# Patient Record
Sex: Female | Born: 1980 | Race: Asian | Hispanic: No | Marital: Married | Smoking: Never smoker
Health system: Southern US, Community
[De-identification: ages and names within clinical notes are randomized; demographics above are authoritative.]

---

## 2015-03-06 ENCOUNTER — Encounter: Payer: Self-pay | Admitting: Family Medicine

## 2015-03-06 ENCOUNTER — Ambulatory Visit (INDEPENDENT_AMBULATORY_CARE_PROVIDER_SITE_OTHER): Payer: BLUE CROSS/BLUE SHIELD | Admitting: Family Medicine

## 2015-03-06 VITALS — BP 106/68 | HR 76 | Temp 98.3°F | Resp 16 | Ht 60.0 in | Wt 101.4 lb

## 2015-03-06 DIAGNOSIS — R1013 Epigastric pain: Secondary | ICD-10-CM | POA: Diagnosis not present

## 2015-03-06 DIAGNOSIS — Z789 Other specified health status: Secondary | ICD-10-CM

## 2015-03-06 LAB — POCT CBC
GRANULOCYTE PERCENT: 74.8 % (ref 37–80)
HCT, POC: 41 % (ref 37.7–47.9)
Hemoglobin: 13.5 g/dL (ref 12.2–16.2)
LYMPH, POC: 1 (ref 0.6–3.4)
MCH: 30.3 pg (ref 27–31.2)
MCHC: 32.9 g/dL (ref 31.8–35.4)
MCV: 92 fL (ref 80–97)
MID (cbc): 0.3 (ref 0–0.9)
MPV: 7.5 fL (ref 0–99.8)
POC GRANULOCYTE: 4 (ref 2–6.9)
POC LYMPH PERCENT: 19.5 %L (ref 10–50)
POC MID %: 5.7 % (ref 0–12)
Platelet Count, POC: 243 10*3/uL (ref 142–424)
RBC: 4.45 M/uL (ref 4.04–5.48)
RDW, POC: 13.9 %
WBC: 5.3 10*3/uL (ref 4.6–10.2)

## 2015-03-06 LAB — COMPLETE METABOLIC PANEL WITH GFR
ALK PHOS: 37 U/L — AB (ref 39–117)
ALT: 14 U/L (ref 0–35)
AST: 17 U/L (ref 0–37)
Albumin: 4.7 g/dL (ref 3.5–5.2)
BILIRUBIN TOTAL: 0.6 mg/dL (ref 0.2–1.2)
BUN: 12 mg/dL (ref 6–23)
CHLORIDE: 105 meq/L (ref 96–112)
CO2: 28 mEq/L (ref 19–32)
Calcium: 8.9 mg/dL (ref 8.4–10.5)
Creat: 0.51 mg/dL (ref 0.50–1.10)
GFR, Est African American: >89 mL/min
Glucose, Bld: 98 mg/dL (ref 70–99)
POTASSIUM: 3.8 meq/L (ref 3.5–5.3)
Sodium: 139 mEq/L (ref 135–145)
Total Protein: 7.2 g/dL (ref 6.0–8.3)

## 2015-03-06 LAB — POCT UA - MICROSCOPIC ONLY
Casts, Ur, LPF, POC: NEGATIVE
Crystals, Ur, HPF, POC: NEGATIVE
Mucus, UA: NEGATIVE
Yeast, UA: NEGATIVE

## 2015-03-06 LAB — POCT URINALYSIS DIPSTICK
BILIRUBIN UA: NEGATIVE
GLUCOSE UA: NEGATIVE
Ketones, UA: NEGATIVE
NITRITE UA: NEGATIVE
Protein, UA: NEGATIVE
UROBILINOGEN UA: 0.2
pH, UA: 5.5

## 2015-03-06 MED ORDER — PANTOPRAZOLE SODIUM 40 MG PO TBEC: 40.0000 mg | DELAYED_RELEASE_TABLET | Freq: Every day | ORAL | Status: DC

## 2015-03-06 NOTE — Patient Instructions (Addendum)
Take the pantoprazole one pill daily in the morning before breakfast. Wait about 30 minutes before you eat.  Take the first pill today when you get the medicine  If you get worse pain or fevers either return here or go to the emergency room at any time  If you're not doing better in the next 2 or 3 weeks you should return for a recheck  One laboratory test is still pending. It went to another lab and we will let you know the results of that in a few days when I get it. If it is all normal allergist send you a normal lab letter. If I have anything I need to communicate someone will try and call you.  We do not need to send you to a specialist for an endoscopy at this time, but if problems continued to persist we would need to consider doing so.  [facility] [address]

## 2015-03-06 NOTE — Progress Notes (Signed)
Subjective:  Patient ID: Lindsey Church, female    DOB: 05/22/1981  Age: 34 y.o. MRN: [id number]  34 year old lady who is here complaining of epigastric pain. She's been hurting a good deal last 2 days. It is not related to eating or moving her bowels or urinating. She has had a problem also on over the last couple of years but this is worse. She is gravida 2 para 2 with last child being 32-year-old. She is not on any regular medications. She's not taking any medicine for this. We used a telephone Mandarin interpreter for her. She was accompanied by her husband who does speak some [place]. Apparently she has been in the [place] for 10 years and works in a [employer] but has not learned any significant English. The husband says he works in Clinical biochemist questions in [place]. Objective:   No acute distress. Healthy-appearing young lady. Chest clear. Heart regular without murmurs. Abdomen has normal bowel sounds. She has a lot of stretch marks. It is soft without organomegaly or masses. Mild tenderness in the epigastrium.  Assessment & Plan:   Assessment: Epigastric and abdominal pain  Plan: CBC, urinalysis, and CMP. The patient communicated through the telephone interpreter and seems to understand. She expresses she had no more questions.   Will treat her symptomatically with lansoprazole.  Results for orders placed or performed in visit on 03/06/15  POCT CBC  Result Value Ref Range   WBC 5.3 4.6 - 10.2 K/uL   Lymph, poc 1.0 0.6 - 3.4   POC LYMPH PERCENT 19.5 10 - 50 %L   MID (cbc) 0.3 0 - 0.9   POC MID % 5.7 0 - 12 %M   POC Granulocyte 4.0 2 - 6.9   Granulocyte percent 74.8 37 - 80 %G   RBC 4.45 4.04 - 5.48 M/uL   Hemoglobin 13.5 12.2 - 16.2 g/dL   HCT, POC 11.9 14.7 - 47.9 %   MCV 92.0 80 - 97 fL   MCH, POC 30.3 27 - 31.2 pg   MCHC 32.9 31.8 - 35.4 g/dL   RDW, POC 82.9 %   Platelet Count, POC 243 142 - 424 K/uL   MPV 7.5 0 - 99.8 fL  POCT urinalysis dipstick   Result Value Ref Range   Color, UA clear    Clarity, UA clear    Glucose, UA neg    Bilirubin, UA neg    Ketones, UA neg    Spec Grav, UA <=1.005    Blood, UA large    pH, UA 5.5    Protein, UA neg    Urobilinogen, UA 0.2    Nitrite, UA neg    Leukocytes, UA Trace   POCT UA - Microscopic Only  Result Value Ref Range   WBC, Ur, HPF, POC 0-1    RBC, urine, microscopic 0-1    Bacteria, U Microscopic trace    Mucus, UA neg    Epithelial cells, urine per micros 0-3    Crystals, Ur, HPF, POC neg    Casts, Ur, LPF, POC neg    Yeast, UA neg     Patient Instructions  Take the pantoprazole one pill daily in the morning before breakfast. Wait about 30 minutes before you eat.  Take the first pill today when you get the medicine  If you get worse pain or fevers either return here or go to the emergency room at any time  If you're not doing better in the next  2 or 3 weeks you should return for a recheck  One laboratory test is still pending. It went to another lab and we will let you know the results of that in a few days when I get it. If it is all normal allergist send you a normal lab letter. If I have anything I need to communicate someone will try and call you.  We do not need to send you to a specialist for an endoscopy at this time, but if problems continued to persist we would need to consider doing so.  [facility] [address] from [employer]     Beryle Bagsby, MD 03/06/2015

## 2015-03-07 ENCOUNTER — Encounter: Payer: Self-pay | Admitting: Radiology

## 2015-07-25 ENCOUNTER — Ambulatory Visit (INDEPENDENT_AMBULATORY_CARE_PROVIDER_SITE_OTHER): Payer: BLUE CROSS/BLUE SHIELD | Admitting: Internal Medicine

## 2015-07-25 VITALS — BP 100/62 | HR 97 | Temp 98.6°F | Resp 18 | Ht 60.0 in | Wt 102.0 lb

## 2015-07-25 DIAGNOSIS — M546 Pain in thoracic spine: Secondary | ICD-10-CM

## 2015-07-25 DIAGNOSIS — B079 Viral wart, unspecified: Secondary | ICD-10-CM

## 2015-07-25 LAB — POCT URINALYSIS DIP (MANUAL ENTRY)
BILIRUBIN UA: NEGATIVE
Blood, UA: NEGATIVE
GLUCOSE UA: NEGATIVE
Ketones, POC UA: NEGATIVE
Leukocytes, UA: NEGATIVE
NITRITE UA: NEGATIVE
Protein Ur, POC: NEGATIVE
Spec Grav, UA: 1.01
Urobilinogen, UA: 0.2
pH, UA: 6

## 2015-07-25 LAB — POC MICROSCOPIC URINALYSIS (UMFC): Mucus: ABSENT

## 2015-07-25 MED ORDER — MELOXICAM 15 MG PO TABS: 15.0000 mg | ORAL_TABLET | Freq: Every day | ORAL | Status: DC

## 2015-07-25 NOTE — Progress Notes (Signed)
   Subjective:    Patient ID: Lindsey Church, female    DOB: 07/28/1981, 34 y.o.   MRN: [id number] This chart was scribed for Ellamae Siaobert Maddeline Roorda, MD by Jolene Provostobert Halas, Medical Scribe. This patient was seen in Room 12 and the patient's care was started a 4:08 PM.  Chief Complaint  Patient presents with  . Back Pain    rt side x1 week    Here with husband and both speak Mandarin  HPI HPI Comments: Lindsey Church is a 34 y.o. female who [facility] of R flank pain for the 4 days. She denies abdominal pain, nausea, or vomiting. She also denies SOB,cough, or difficulty breathing. No dysuria frequency urgency. Although uncertain of any injury, She has increased pain with spinal rotation and with bending over.   She is also complaining of warts for the last several months on her right hand. LMP 10/10  No meds or illnesses  Review of Systems  Constitutional: Negative for fever and chills.  Gastrointestinal: Negative for nausea, vomiting, diarrhea and constipation.  Genitourinary: Negative for dysuria, urgency and frequency.      Objective:  BP 100/62 mmHg  Pulse 97  Temp(Src) 98.6 F (37 C) (Oral)  Resp 18  Ht 5' (1.524 m)  Wt 102 lb (46.267 kg)  BMI 19.92 kg/m2  SpO2 98%  LMP 07/13/2015  Physical Exam  Constitutional: She is oriented to person, place, and time. She appears well-developed and well-nourished. No distress.  HENT:  Head: Normocephalic and atraumatic.  Eyes: Pupils are equal, round, and reactive to light.  Neck: Normal range of motion. Neck supple. No thyromegaly present.  Cardiovascular: Normal rate, regular rhythm and normal heart sounds.   Pulmonary/Chest: Effort normal and breath sounds normal. No respiratory distress.  Abdominal: Soft. There is no tenderness.  Musculoskeletal: Normal range of motion.  Tender at the lower costal margin on the right posteriorly without defect or swelling.  No ecchymoses Pain worse with twisting and reaching    Lymphadenopathy:    She has no cervical adenopathy.  Neurological: She is alert and oriented to person, place, and time. Coordination normal.  Skin: Skin is warm and dry. She is not diaphoretic.  Plantar warts palmer surface, fingers 2 and 3 on the right hand.  Psychiatric: She has a normal mood and affect. Her behavior is normal.  Nursing note and vitals reviewed. BP 100/62 mmHg  Pulse 97  Temp(Src) 98.6 F (37 C) (Oral)  Resp 18  Ht 5' (1.524 m)  Wt 102 lb (46.267 kg)  BMI 19.92 kg/m2  SpO2 98%  LMP 07/13/2015  Procedure: Liquid nitrogen application to the 2 warts with full thickness freezing twice each    Assessment & Plan:  Right-sided thoracic back pain - Plan: POCT Microscopic Urinalysis ([facility]), POCT urinalysis dipstick --Stretching, heat, meloxicam for 3 weeks with follow-up evaluation and x-rays if not well  Cutaneous wart --Follow-up 3-4 weeks for additional freezing if necessary   I have completed the patient encounter in its entirety as documented by the scribe, with editing by me where necessary. Dow Blahnik P. Merla Richesoolittle, M.D.   By signing my name below, I, Javier Dockerobert Ryan Halas, attest that this documentation has been prepared under the direction and in the presence of Ellamae Siaobert Codey Burling, MD. Electronically Signed: Javier Dockerobert Ryan Halas, ER Scribe. 07/25/2015. 4:08 PM.

## 2016-04-16 ENCOUNTER — Ambulatory Visit (INDEPENDENT_AMBULATORY_CARE_PROVIDER_SITE_OTHER): Payer: BLUE CROSS/BLUE SHIELD | Admitting: Physician Assistant

## 2016-04-16 ENCOUNTER — Ambulatory Visit: Payer: BLUE CROSS/BLUE SHIELD

## 2016-04-16 VITALS — BP 108/68 | HR 75 | Temp 98.1°F | Resp 12 | Ht 60.0 in | Wt 94.0 lb

## 2016-04-16 DIAGNOSIS — R599 Enlarged lymph nodes, unspecified: Secondary | ICD-10-CM | POA: Diagnosis not present

## 2016-04-16 DIAGNOSIS — D72819 Decreased white blood cell count, unspecified: Secondary | ICD-10-CM

## 2016-04-16 DIAGNOSIS — R591 Generalized enlarged lymph nodes: Secondary | ICD-10-CM

## 2016-04-16 LAB — POCT CBC
Granulocyte percent: 55.4 %G (ref 37–80)
HCT, POC: 37.6 % — AB (ref 37.7–47.9)
HEMOGLOBIN: 13.1 g/dL (ref 12.2–16.2)
LYMPH, POC: 0.9 (ref 0.6–3.4)
MCH: 31.5 pg — AB (ref 27–31.2)
MCHC: 34.9 g/dL (ref 31.8–35.4)
MCV: 90.3 fL (ref 80–97)
MID (cbc): 0.3 (ref 0–0.9)
MPV: 8.2 fL (ref 0–99.8)
POC Granulocyte: 1.5 — AB (ref 2–6.9)
POC LYMPH PERCENT: 34.6 %L (ref 10–50)
POC MID %: 10 %M (ref 0–12)
Platelet Count, POC: 154 10*3/uL (ref 142–424)
RBC: 4.17 M/uL (ref 4.04–5.48)
RDW, POC: 12.7 %
WBC: 2.7 10*3/uL — AB (ref 4.6–10.2)

## 2016-04-16 MED ORDER — AZITHROMYCIN 250 MG PO TABS: ORAL_TABLET | ORAL | Status: DC

## 2016-04-16 NOTE — Progress Notes (Signed)
04/16/2016 12:38 PM   DOB: 1981-02-20 / MRN: [id number]  SUBJECTIVE:  Lindsey Church is a 35 y.o. female presenting for anterior neck pain and swelling.  This started 7 days ago.  She denies sore throat, fever, chills, nausea, cat scratches. She denies a change in the size of the lesion. Reports she did have a cold last week and this problem started after. She denies any dysphagia, odynophagia,  She has No Known Allergies.   She  has no past medical history on file.    She  reports that she has never smoked. She does not have any smokeless tobacco history on file. She reports that she does not drink alcohol or use illicit drugs. She  has no sexual activity history on file. The patient  has no past surgical history on file.  Her family history is not on file.  Review of Systems  Constitutional: Negative for fever and chills.  Respiratory: Negative for cough.   Cardiovascular: Negative for chest pain.  Genitourinary: Negative for dysuria.  Skin: Negative for itching and rash.  Neurological: Negative for dizziness and headaches.    Problem list and medications reviewed and updated by myself where necessary, and exist elsewhere in the encounter.   OBJECTIVE:  BP 108/68 mmHg  Pulse 75  Temp(Src) 98.1 F (36.7 C) (Oral)  Resp 12  Ht 5' (1.524 m)  Wt 94 lb (42.638 kg)  BMI 18.36 kg/m2  SpO2 99%  Physical Exam  Constitutional: She is oriented to person, place, and time. Vital signs are normal. She appears well-developed and well-nourished.  Non-toxic appearance. She does not have a sickly appearance. She does not appear ill. No distress.  Cardiovascular: Normal rate, regular rhythm and normal heart sounds.   Pulmonary/Chest: Effort normal and breath sounds normal.  Abdominal: Soft. Bowel sounds are normal.  Neurological: She is alert and oriented to person, place, and time.    Results for orders placed or performed in visit on 04/16/16 (from the past 72 hour(s))  POCT CBC      Status: Abnormal   Collection Time: 04/16/16 11:40 AM  Result Value Ref Range   WBC 2.7 (A) 4.6 - 10.2 K/uL   Lymph, poc 0.9 0.6 - 3.4   POC LYMPH PERCENT 34.6 10 - 50 %L   MID (cbc) 0.3 0 - 0.9   POC MID % 10.0 0 - 12 %M   POC Granulocyte 1.5 (A) 2 - 6.9   Granulocyte percent 55.4 37 - 80 %G   RBC 4.17 4.04 - 5.48 M/uL   Hemoglobin 13.1 12.2 - 16.2 g/dL   HCT, POC 78.2 (A) 95.6 - 47.9 %   MCV 90.3 80 - 97 fL   MCH, POC 31.5 (A) 27 - 31.2 pg   MCHC 34.9 31.8 - 35.4 g/dL   RDW, POC 21.3 %   Platelet Count, POC 154 142 - 424 K/uL   MPV 8.2 0 - 99.8 fL    No results found.  ASSESSMENT AND PLAN  Lindsey Church was seen today for neck pain.  Diagnoses and all orders for this visit:  Lymphadenopathy of head and neck: This started after a what sounds like a viral URI that has since resolved.  The node is tender.  I will cover with azithromycin and have advised it is okay to wait for a total of three weeks before ultrasound. They have been given the number to pacific interpreters and will call in 2 weeks if this problem is still persisting.  -  POCT CBC -     DG Neck Soft Tissue; Future -     Pathologist smear review  Leukopenia -     Pathologist smear review    The patient was advised to call or return to clinic if she does not see an improvement in symptoms, or to seek the care of the closest emergency department if she worsens with the above plan.   Deliah BostonMichael Clark, MHS, PA-C [facility] 04/16/2016 12:38 PM   [phone]

## 2016-04-16 NOTE — Patient Instructions (Signed)
     IF you received an x-ray today, you will receive an invoice from [facility]. Please contact [facility] at [phone] with questions or concerns regarding your invoice.   IF you received labwork today, you will receive an invoice from [facility]. Please contact [facility] at [phone] with questions or concerns regarding your invoice.   Our billing staff will not be able to assist you with questions regarding bills from these companies.  You will be contacted with the lab results as soon as they are available. The fastest way to get your results is to activate your My Chart account. Instructions are located on the last page of this paperwork. If you have not heard from us regarding the results in 2 weeks, please contact this office.      

## 2016-04-18 LAB — PATHOLOGIST SMEAR REVIEW

## 2016-04-30 ENCOUNTER — Ambulatory Visit (INDEPENDENT_AMBULATORY_CARE_PROVIDER_SITE_OTHER): Payer: BLUE CROSS/BLUE SHIELD | Admitting: Physician Assistant

## 2016-04-30 DIAGNOSIS — R599 Enlarged lymph nodes, unspecified: Secondary | ICD-10-CM

## 2016-04-30 DIAGNOSIS — D72819 Decreased white blood cell count, unspecified: Secondary | ICD-10-CM | POA: Insufficient documentation

## 2016-04-30 DIAGNOSIS — R590 Localized enlarged lymph nodes: Secondary | ICD-10-CM | POA: Insufficient documentation

## 2016-04-30 LAB — POCT CBC
GRANULOCYTE PERCENT: 52.4 % (ref 37–80)
HEMATOCRIT: 38.1 % (ref 37.7–47.9)
HEMOGLOBIN: 13.2 g/dL (ref 12.2–16.2)
Lymph, poc: 1.1 (ref 0.6–3.4)
MCH: 30.9 pg (ref 27–31.2)
MCHC: 34.5 g/dL (ref 31.8–35.4)
MCV: 89.3 fL (ref 80–97)
MID (cbc): 0.2 (ref 0–0.9)
MPV: 7.8 fL (ref 0–99.8)
POC GRANULOCYTE: 1.5 — AB (ref 2–6.9)
POC LYMPH PERCENT: 39.4 %L (ref 10–50)
POC MID %: 8.2 %M (ref 0–12)
Platelet Count, POC: 188 10*3/uL (ref 142–424)
RBC: 4.26 M/uL (ref 4.04–5.48)
RDW, POC: 12.4 %
WBC: 2.8 10*3/uL — AB (ref 4.6–10.2)

## 2016-04-30 MED ORDER — MELOXICAM 15 MG PO TABS: 15.0000 mg | ORAL_TABLET | Freq: Every day | ORAL | 0 refills | Status: DC

## 2016-04-30 NOTE — Progress Notes (Signed)
Patient ID: Lindsey Church, female    DOB: [birth year], 35 y.o.   MRN: [id number]  PCP: No PCP Per Patient  Subjective:   Chief Complaint  Patient presents with  . Neck Pain    recheck from 2 weeks ago    HPI Presents for evaluation of LEFT neck lymphadenopathy. She is accompanied by her husband. [employer]pre[employer] used for [place], though she and her husband both understand some [place].  She was seen here 2 weeks ago by a colleague for a tender lump in the LEFT neck following a viral URI. It was tender on palpation. She had no fever, chills. CBC was notable for leucopenia, peripheral smear revealed absolute neutropenia, no immature cells. She was prescribed a Zpack and meloxicam and advised to RTC in 2 weeks.  The lump on the LEFT neck has resolved. There is no pain at the site. Now she has pain at the base of the neck on the same side. No fever, chills. No nausea or vomiting. No sore throat. No cough. No neck pain with ROM, only with palpation. This new area first appeared during childhood and is not growing in size. It would hurt only when she would get a cold, and would resolve with medication. The pain here began 2 days ago, without associated URI-type symptoms.   Review of Systems As above.    There are no active problems to display for this patient.    Prior to Admission medications   Medication Sig Start Date End Date Taking? Authorizing Provider  azithromycin (ZITHROMAX) 250 MG tablet Take 2 tabs on day one and 1 daily thereafter. 04/16/16   Ofilia Neas, PA-C  meloxicam (MOBIC) 15 MG tablet Take 1 tablet (15 mg total) by mouth daily. For back pain Patient not taking: Reported on 04/16/2016 07/25/15   Tonye Pearson, MD     No Known Allergies     Objective:  Physical Exam  Constitutional: She is oriented to person, place, and time. She appears well-developed and well-nourished. She is active and cooperative. No distress.  BP (!) 88/58  (BP Location: Right Arm, Patient Position: Sitting, Cuff Size: Normal)   Pulse 65   Temp 98.1 F (36.7 C) (Oral)   Resp 16   Ht 5' 0.5" (1.537 m)   Wt 94 lb 4 oz (42.8 kg)   LMP 04/16/2016 (Approximate)   SpO2 98%   BMI 18.10 kg/m   HENT:  Head: Normocephalic and atraumatic.  Right Ear: Hearing normal.  Left Ear: Hearing normal.  Eyes: Conjunctivae are normal. No scleral icterus.  Neck: Normal range of motion. Neck supple. No thyromegaly present.  Cardiovascular: Normal rate, regular rhythm and normal heart sounds.   Pulses:      Radial pulses are 2+ on the right side, and 2+ on the left side.  Pulmonary/Chest: Effort normal and breath sounds normal.  Lymphadenopathy:       Head (right side): No tonsillar, no preauricular, no posterior auricular and no occipital adenopathy present.       Head (left side): Submandibular (non-tender, 1.5 cm) adenopathy present. No tonsillar, no preauricular, no posterior auricular and no occipital adenopathy present.    She has cervical adenopathy.       Right cervical: No superficial cervical, no deep cervical and no posterior cervical adenopathy present.      Left cervical: Posterior cervical (tender, 1 cm, several) adenopathy present. No superficial cervical and no deep cervical adenopathy present.  Right: No supraclavicular adenopathy present.       Left: No supraclavicular adenopathy present.  Neurological: She is alert and oriented to person, place, and time. No sensory deficit.  Skin: Skin is warm, dry and intact. No rash noted. No cyanosis or erythema. Nails show no clubbing.  Psychiatric: She has a normal mood and affect. Her speech is normal and behavior is normal.      Results for orders placed or performed in visit on 04/30/16  POCT CBC  Result Value Ref Range   WBC 2.8 (A) 4.6 - 10.2 K/uL   Lymph, poc 1.1 0.6 - 3.4   POC LYMPH PERCENT 39.4 10 - 50 %L   MID (cbc) 0.2 0 - 0.9   POC MID % 8.2 0 - 12 %M   POC Granulocyte 1.5  (A) 2 - 6.9   Granulocyte percent 52.4 37 - 80 %G   RBC 4.26 4.04 - 5.48 M/uL   Hemoglobin 13.2 12.2 - 16.2 g/dL   HCT, POC 88.9 16.9 - 47.9 %   MCV 89.3 80 - 97 fL   MCH, POC 30.9 27 - 31.2 pg   MCHC 34.5 31.8 - 35.4 g/dL   RDW, POC 45.0 %   Platelet Count, POC 188 142 - 424 K/uL   MPV 7.8 0 - 99.8 fL        Assessment & Plan:   1. Left cervical lymphadenopathy 2. Leucopenia CBC is unchanged. She has no new symptoms to explain the tender nodes. Schedule US of the neck to evaluate further. Consider hematology evaluation. Resume meloxicam. - POCT CBC - US Soft Tissue Head/Neck; Future - meloxicam (MOBIC) 15 MG tablet; Take 1 tablet (15 mg total) by mouth daily. For back pain  Dispense: 15 tablet; Refill: 0    Fernande Bras, PA-C Physician Assistant-Certified [facility]

## 2016-04-30 NOTE — Patient Instructions (Addendum)
Please continue the anti-inflammatory medication (Meloxicam). The imaging facility will contact you directly to schedule the ultrasound.    IF you received an x-ray today, you will receive an invoice from [facility]. Please contact [facility] at [phone] with questions or concerns regarding your invoice.   IF you received labwork today, you will receive an invoice from [employer]. Please contact [facility] at [phone] with questions or concerns regarding your invoice.   Our billing staff will not be able to assist you with questions regarding bills from these companies.  You will be contacted with the lab results as soon as they are available. The fastest way to get your results is to activate your My Chart account. Instructions are located on the last page of this paperwork. If you have not heard from [place] regarding the results in 2 weeks, please contact this office.

## 2016-05-09 ENCOUNTER — Ambulatory Visit
Admission: RE | Admit: 2016-05-09 | Discharge: 2016-05-09 | Disposition: A | Discharge status: Home / Self Care | Payer: BLUE CROSS/BLUE SHIELD | Source: Ambulatory Visit | Attending: Physician Assistant | Admitting: Physician Assistant

## 2016-05-09 DIAGNOSIS — R590 Localized enlarged lymph nodes: Secondary | ICD-10-CM

## 2016-05-17 ENCOUNTER — Ambulatory Visit (INDEPENDENT_AMBULATORY_CARE_PROVIDER_SITE_OTHER): Payer: BLUE CROSS/BLUE SHIELD | Admitting: Physician Assistant

## 2016-05-17 VITALS — BP 98/64 | HR 77 | Temp 97.9°F | Resp 16 | Ht 60.0 in | Wt 95.4 lb

## 2016-05-17 DIAGNOSIS — R590 Localized enlarged lymph nodes: Secondary | ICD-10-CM

## 2016-05-17 LAB — POCT URINE PREGNANCY: Preg Test, Ur: NEGATIVE

## 2016-05-17 NOTE — Progress Notes (Signed)
05/17/2016 4:59 PM   DOB: 01/30/1981 / MRN: [id number]  SUBJECTIVE:  Lindsey Church is a 35 y.o. female presenting for neck pain.  This started yesterday. I have seen her in the past for left sided tonsillar lymphadenopathy. She reports to me that the problem has now started in her posterior cervical nodes.  She has been given azithromycin and recently received an ultrasound of the tonsillar node and this was positive only for an enlarged lymph node. She has tried Meloxicam and this has not helped either.    She also complains of sore legs bilaterally, she denies leg swelling. d  She has No Known Allergies.   She  has no past medical history on file.    She  reports that she has never smoked. She has never used smokeless tobacco. She reports that she does not drink alcohol or use drugs. She  has no sexual activity history on file. The patient  has no past surgical history on file.  Her family history is not on file.  Review of Systems  Constitutional: Negative for fever.  HENT: Negative for ear pain and sore throat.   Respiratory: Negative for cough.   Musculoskeletal: Negative for myalgias.  Skin: Negative for rash.  Neurological: Negative for focal weakness and headaches.    The problem list and medications were reviewed and updated by myself where necessary and exist elsewhere in the encounter.   OBJECTIVE:  BP 98/64 (BP Location: Right Arm, Patient Position: Sitting, Cuff Size: Normal)   Pulse 77   Temp 97.9 F (36.6 C)   Resp 16   Ht 5' (1.524 m)   Wt 95 lb 6.4 oz (43.3 kg)   LMP 04/16/2016 (Approximate)   SpO2 99%   BMI 18.63 kg/m   Physical Exam  Cardiovascular: Normal rate, regular rhythm and normal heart sounds.   Pulmonary/Chest: Effort normal and breath sounds normal.  Lymphadenopathy:       Head (right side): No submental, no submandibular, no tonsillar, no preauricular, no posterior auricular and no occipital adenopathy present.       Head (left side):  Tonsillar adenopathy present. No submental, no submandibular, no preauricular, no posterior auricular and no occipital adenopathy present.    She has cervical adenopathy.       Right cervical: No superficial cervical, no deep cervical and no posterior cervical adenopathy present.      Left cervical: Posterior cervical adenopathy present. No superficial cervical and no deep cervical adenopathy present.    She has no axillary adenopathy.       Right: No inguinal and no supraclavicular adenopathy present.       Left: No inguinal and no supraclavicular adenopathy present.    Recent Results (from the past 2160 hour(s))  POCT CBC     Status: Abnormal   Collection Time: 04/16/16 11:40 AM  Result Value Ref Range   WBC 2.7 (A) 4.6 - 10.2 K/uL   Lymph, poc 0.9 0.6 - 3.4   POC LYMPH PERCENT 34.6 10 - 50 %L   MID (cbc) 0.3 0 - 0.9   POC MID % 10.0 0 - 12 %M   POC Granulocyte 1.5 (A) 2 - 6.9   Granulocyte percent 55.4 37 - 80 %G   RBC 4.17 4.04 - 5.48 M/uL   Hemoglobin 13.1 12.2 - 16.2 g/dL   HCT, POC 40.937.6 (A) 81.137.7 - 47.9 %   MCV 90.3 80 - 97 fL   MCH, POC 31.5 (A) 27 - 31.2  pg   MCHC 34.9 31.8 - 35.4 g/dL   RDW, POC 16.112.7 %   Platelet Count, POC 154 142 - 424 K/uL   MPV 8.2 0 - 99.8 fL  Pathologist smear review     Status: None   Collection Time: 04/16/16 12:01 PM  Result Value Ref Range   Path Review SEE NOTE     Comment: 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  Myeloid population consists predominantly of mature segmented neutrophils with reactive changes. No immature cells are identified.  Platelets are unremarkable.  Scattered elliptocytes noted. Reviewed by Nehemiah MassedMarisa C. Mammarappallil MD (Electronic Signature on File) 04/18/16   POCT CBC     Status: Abnormal   Collection Time: 04/30/16 12:40 PM  Result Value Ref Range   WBC 2.8 (A) 4.6 - 10.2 K/uL   Lymph, poc 1.1 0.6 - 3.4   POC LYMPH PERCENT 39.4 10 - 50 %L   MID (cbc) 0.2 0 - 0.9   POC MID % 8.2 0 - 12 %M   POC Granulocyte 1.5  (A) 2 - 6.9   Granulocyte percent 52.4 37 - 80 %G   RBC 4.26 4.04 - 5.48 M/uL   Hemoglobin 13.2 12.2 - 16.2 g/dL   HCT, POC 09.638.1 04.537.7 - 47.9 %   MCV 89.3 80 - 97 fL   MCH, POC 30.9 27 - 31.2 pg   MCHC 34.5 31.8 - 35.4 g/dL   RDW, POC 40.912.4 %   Platelet Count, POC 188 142 - 424 K/uL   MPV 7.8 0 - 99.8 fL   Wt Readings from Last 3 Encounters:  05/17/16 95 lb 6.4 oz (43.3 kg)  04/30/16 94 lb 4 oz (42.8 kg)  04/16/16 94 lb (42.6 kg)     No results found for this or any previous visit (from the past 72 hour(s)).  No results found.  ASSESSMENT AND PLAN  Lindsey Church was seen today for neck pain.  Diagnoses and all orders for this visit:  Lymphadenopathy, cervical: She has had this problem for greater than 1 months now.  She failed azithromycin. US unrevealing and now she has multiple nodes.  I am going to CT her neck and check for other common possibilities. I am going to go ahead and place a referral for ENT and will cancel if there is something that I can treat.  -     TSH -     COMPLETE METABOLIC PANEL WITH GFR -     CBC with Differential/Platelet -     Pathologist smear review -     Mono (Epstein Barr Virus) -     Epstein-Barr virus VCA antibody panel -     CMV abs, IgG+IgM (cytomegalovirus) -     CT Soft Tissue Neck W Contrast; Future -     Ambulatory referral to ENT    The patient is advised to call or return to clinic if she does not see an improvement in symptoms, or to seek the care of the closest emergency department if she worsens with the above plan.   Deliah BostonMichael Clark, MHS, PA-C [facility] 05/17/2016 4:59 PM

## 2016-05-17 NOTE — Patient Instructions (Addendum)
We are running some more test to find out why you are having neck swelling.  Please keep your phone on and ready for when we call about you CAT scan of the neck and the referral to your ENT.     IF you received an x-ray today, you will receive an [facility] from [facility]. Please contact [facility] at [phone] with questions or concerns regarding your invoice.   IF you received labwork today, you will receive an invoice from [facility]. Please contact [facility] at [phone] with questions or concerns regarding your invoice.   Our billing staff will not be able to assist you with questions regarding bills from these companies.  You will be contacted with the lab results as soon as they are available. The fastest way to get your results is to activate your My Chart account. Instructions are located on the last page of this paperwork. If you have not heard from us regarding the results in 2 weeks, please contact this office.

## 2016-05-18 LAB — TSH: TSH: 1.83 m[IU]/L

## 2016-05-18 LAB — COMPLETE METABOLIC PANEL WITH GFR
ALT: 30 U/L — AB (ref 6–29)
AST: 27 U/L (ref 10–30)
Albumin: 4.5 g/dL (ref 3.6–5.1)
Alkaline Phosphatase: 45 U/L (ref 33–115)
BUN: 11 mg/dL (ref 7–25)
CHLORIDE: 104 mmol/L (ref 98–110)
CO2: 27 mmol/L (ref 20–31)
CREATININE: 0.67 mg/dL (ref 0.50–1.10)
Calcium: 9.3 mg/dL (ref 8.6–10.2)
GFR, Est African American: >89 mL/min (ref >=60)
GFR, Est Non African American: >89 mL/min (ref >=60)
Glucose, Bld: 80 mg/dL (ref 65–99)
POTASSIUM: 4.5 mmol/L (ref 3.5–5.3)
SODIUM: 138 mmol/L (ref 135–146)
Total Bilirubin: 0.5 mg/dL (ref 0.2–1.2)
Total Protein: 7.2 g/dL (ref 6.1–8.1)

## 2016-05-18 LAB — CBC WITH DIFFERENTIAL/PLATELET
BASOS PCT: 0 %
Basophils Absolute: 0 cells/uL (ref 0–200)
EOS ABS: 40 {cells}/uL (ref 15–500)
Eosinophils Relative: 2 %
HEMATOCRIT: 41.3 % (ref 35.0–45.0)
HEMOGLOBIN: 13.7 g/dL (ref 11.7–15.5)
LYMPHS ABS: 820 {cells}/uL — AB (ref 850–3900)
LYMPHS PCT: 41 %
MCH: 30.7 pg (ref 27.0–33.0)
MCHC: 33.2 g/dL (ref 32.0–36.0)
MCV: 92.6 fL (ref 80.0–100.0)
MONO ABS: 180 {cells}/uL — AB (ref 200–950)
MPV: 11.1 fL (ref 7.5–12.5)
Monocytes Relative: 9 %
NEUTROS PCT: 48 %
Neutro Abs: 960 cells/uL — ABNORMAL LOW (ref 1500–7800)
Platelets: 199 10*3/uL (ref 140–400)
RBC: 4.46 MIL/uL (ref 3.80–5.10)
RDW: 13 % (ref 11.0–15.0)
WBC: 2 10*3/uL — ABNORMAL LOW (ref 3.8–10.8)

## 2016-05-19 LAB — EPSTEIN-BARR VIRUS VCA ANTIBODY PANEL
EBV NA IgG: 423 U/mL — ABNORMAL HIGH
EBV VCA IgG: 300 U/mL — ABNORMAL HIGH
EBV VCA IgM: <36 U/mL

## 2016-05-19 LAB — PATHOLOGIST SMEAR REVIEW

## 2016-05-20 LAB — CMV ABS, IGG+IGM (CYTOMEGALOVIRUS): CYTOMEGALOVIRUS AB-IGG: 5.7 U/mL — AB (ref <0.60)

## 2016-07-23 ENCOUNTER — Ambulatory Visit (INDEPENDENT_AMBULATORY_CARE_PROVIDER_SITE_OTHER): Payer: BLUE CROSS/BLUE SHIELD | Admitting: Physician Assistant

## 2016-07-23 VITALS — BP 110/68 | HR 110 | Temp 98.2°F | Resp 18 | Ht 60.0 in | Wt 96.2 lb

## 2016-07-23 DIAGNOSIS — N898 Other specified noninflammatory disorders of vagina: Secondary | ICD-10-CM

## 2016-07-23 LAB — POCT WET + KOH PREP
Trich by wet prep: ABSENT
Yeast by KOH: ABSENT
Yeast by wet prep: ABSENT

## 2016-07-23 NOTE — Patient Instructions (Addendum)
  I will send you a letter with the rest of the results.   IF you received an x-ray today, you will receive an invoice from [facility]. Please contact [facility] at [phone] with questions or concerns regarding your invoice.   IF you received labwork today, you will receive an invoice from [facility]. Please contact [facility] at [phone] with questions or concerns regarding your invoice.   Our billing staff will not be able to assist you with questions regarding bills from these companies.  You will be contacted with the lab results as soon as they are available. The fastest way to get your results is to activate your My Chart account. Instructions are located on the last page of this paperwork. If you have not heard from us regarding the results in 2 weeks, please contact this office.

## 2016-07-23 NOTE — Progress Notes (Signed)
   Lindsey Church  MRN: [id number] DOB: 09/13/1981  Subjective:  Pt presents to clinic for \\white  vaginal discharge - no itching - no odor - no vaginal irritation - she has had these symptoms for about a year - she was diagnosed with a vaginal infection and she was given medication but it made her nauseated so she stopped the medication - at this visit she had her pap smear and "other normal things".    LMP - 10/2 Sexual activity - not currently - last year was her last sexual activity   [employer] [facility] - [phone] - mandarin chinese -   Review of Systems  Genitourinary: Positive for vaginal discharge. Negative for dysuria, frequency and menstrual problem.    Patient Active Problem List   Diagnosis Date Noted  . Leucopenia 04/30/2016    No current outpatient prescriptions on file prior to visit.   No current facility-administered medications on file prior to visit.     No Known Allergies  Pt patients past, family and social history were reviewed and updated.  Objective:  BP 110/68 (BP Location: Right Arm, Patient Position: Sitting, Cuff Size: Small)   Pulse (!) 110   Temp 98.2 F (36.8 C) (Oral)   Resp 18   Ht 5' (1.524 m)   Wt 96 lb 3.2 oz (43.6 kg)   LMP 07/04/2016   SpO2 100%   BMI 18.79 kg/m   Physical Exam  Constitutional: She is oriented to person, place, and time and well-developed, well-nourished, and in no distress.  HENT:  Head: Normocephalic and atraumatic.  Right Ear: Hearing and external ear normal.  Left Ear: Hearing and external ear normal.  Eyes: Conjunctivae are normal.  Neck: Normal range of motion.  Pulmonary/Chest: Effort normal.  Genitourinary: Cervix normal and vulva normal. Cervix exhibits no motion tenderness and no tenderness. Thin  white  yellow  green and vaginal discharge found.  Neurological: She is alert and oriented to person, place, and time. Gait normal.  Skin: Skin is warm and dry.  Psychiatric: Mood, memory, affect and  judgment normal.  Vitals reviewed.  Results for orders placed or performed in visit on 07/23/16  POCT Wet + KOH Prep  Result Value Ref Range   Yeast by KOH Absent Present, Absent   Yeast by wet prep Absent Present, Absent   WBC by wet prep Many (A) None, Few, Too numerous to count   Clue Cells Wet Prep HPF POC None None, Too numerous to count   Trich by wet prep Absent Present, Absent   Bacteria Wet Prep HPF POC Many (A) None, Few, Too numerous to count   Epithelial Cells By [employer] Pref ([facility]) Many (A) None, Few, Too numerous to count   RBC,UR,HPF,POC None None RBC/hpf     Assessment and Plan :  Vaginal discharge - Plan: POCT Wet + KOH Prep, GC/Chlamydia Probe Amp   Wet prep is unremarkable - we will wait for genprobe and treat if indicated.  If Rx are needed I will send them to patient with her results.  She understands and agrees.  Benny LennertSarah Jolisa Intriago PA-C  [facility] 07/23/2016 10:40 AM

## 2016-07-25 LAB — GC/CHLAMYDIA PROBE AMP
CT Probe RNA: NOT DETECTED
GC Probe RNA: NOT DETECTED

## 2016-07-26 ENCOUNTER — Encounter: Payer: Self-pay | Admitting: Physician Assistant

## 2016-07-26 ENCOUNTER — Telehealth: Payer: Self-pay | Admitting: Physician Assistant

## 2016-07-27 NOTE — Telephone Encounter (Signed)
Opened in error

## 2017-04-14 IMAGING — US US SOFT TISSUE HEAD/NECK
1 series · 11 of 11 positions shown · non-contrast
Comparison: None.

CLINICAL DATA: 34-year-old female with a history

EXAM:
ULTRASOUND OF HEAD/NECK SOFT TISSUES
TECHNIQUE: Ultrasound examination of the head and neck soft tissues was
performed in the area of clinical concern.

[Series 1: us soft tissue head/neck · 0.06mm/px · 11 acquisitions, 11 frames shown]
[im 1/11]
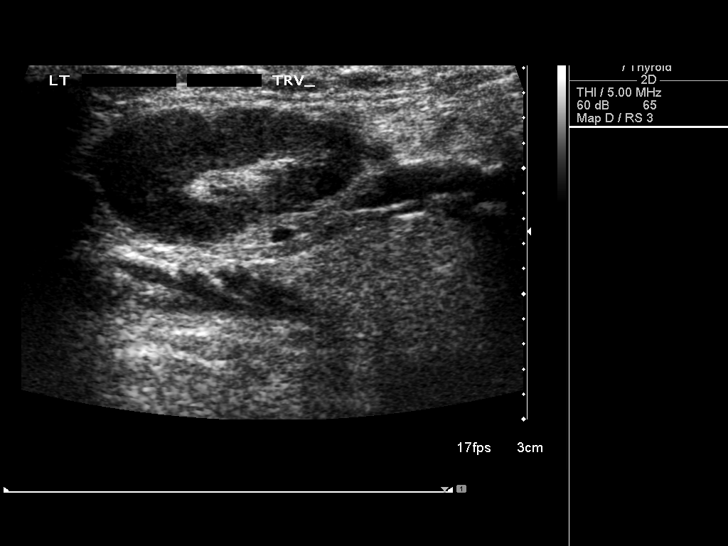
[im 2/11]
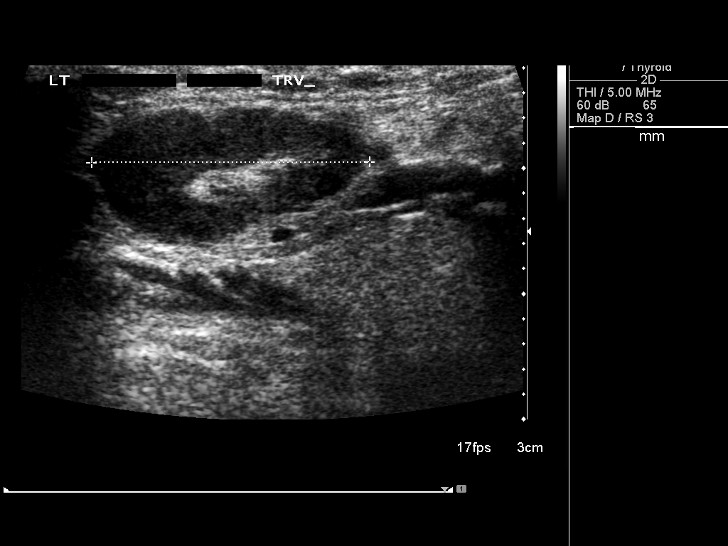
[im 3/11]
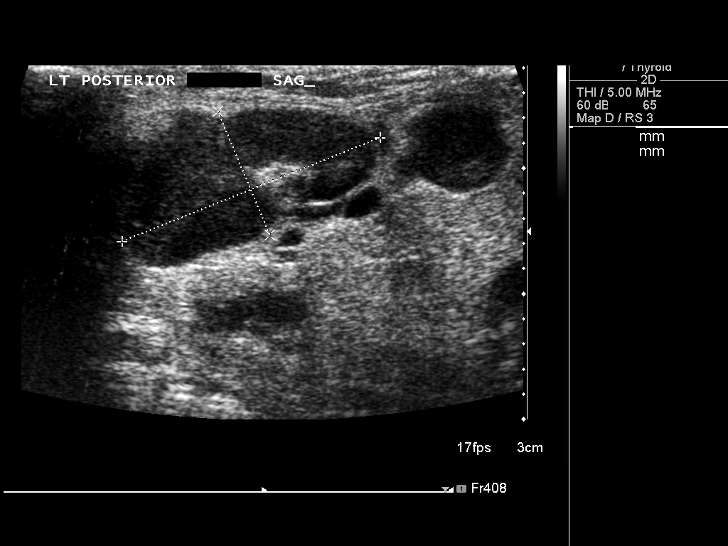
[im 4/11]
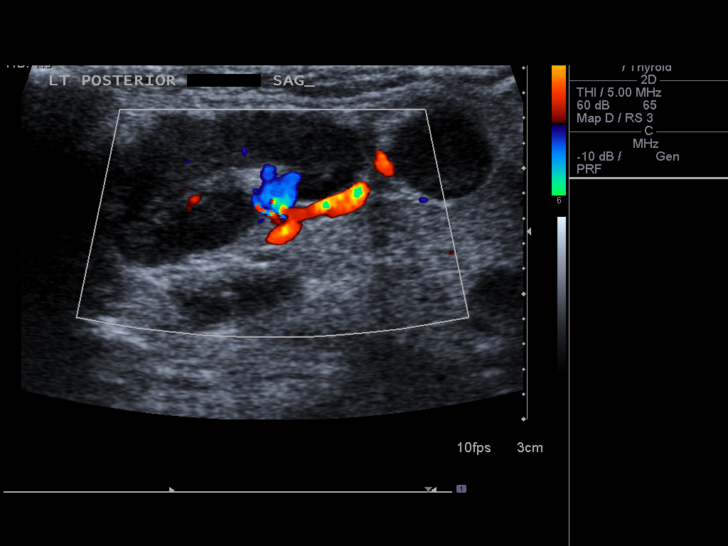
[im 5/11]
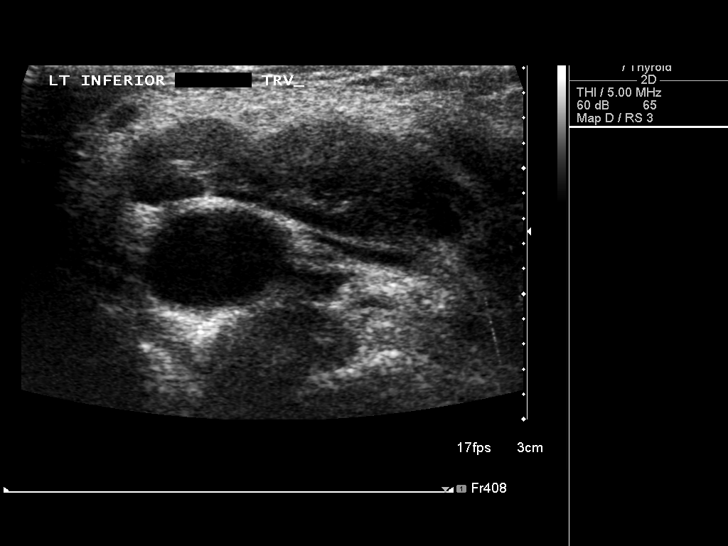
[im 6/11]
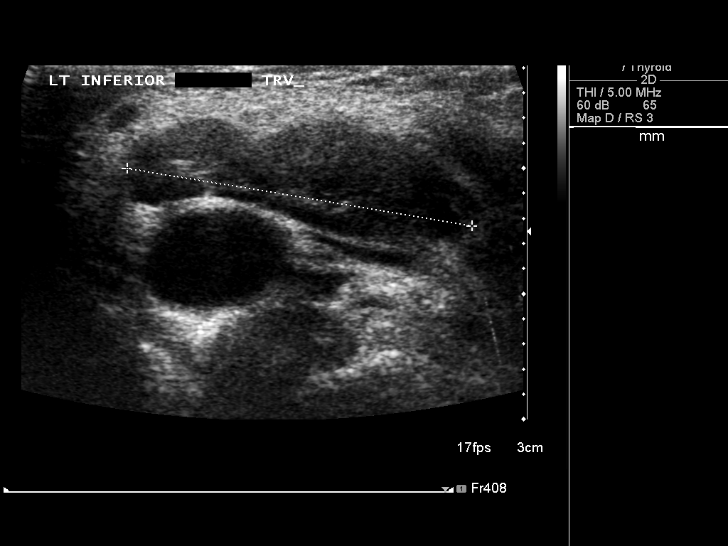
[im 7/11]
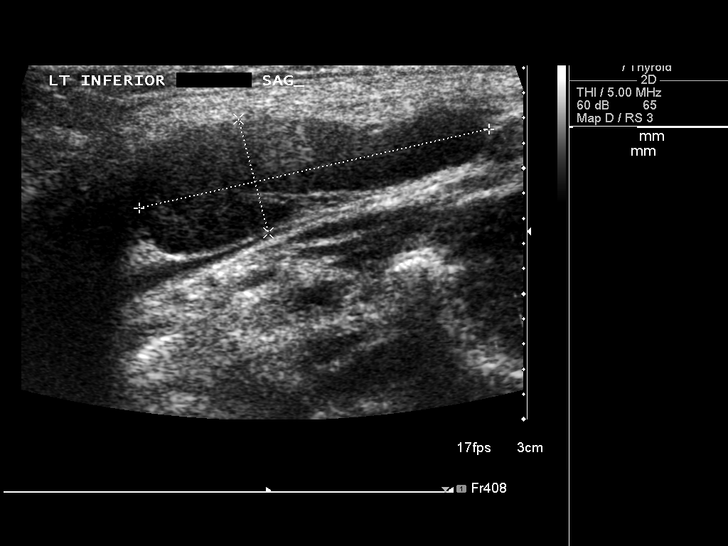
[im 8/11]
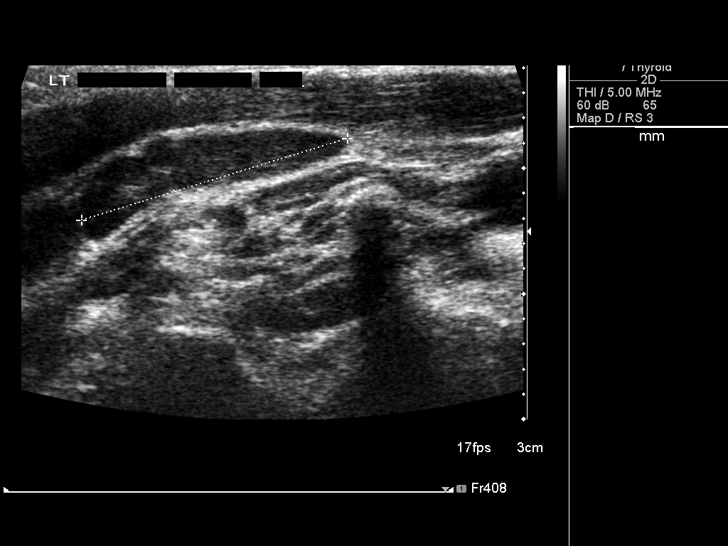
[im 9/11]
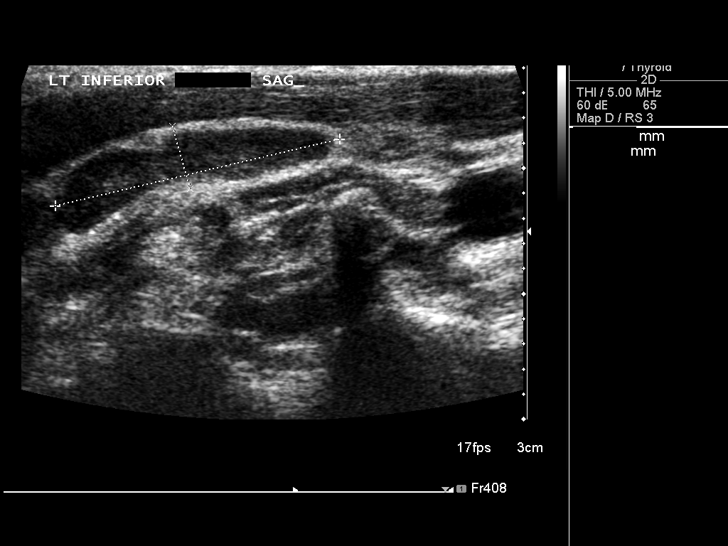
[im 10/11]
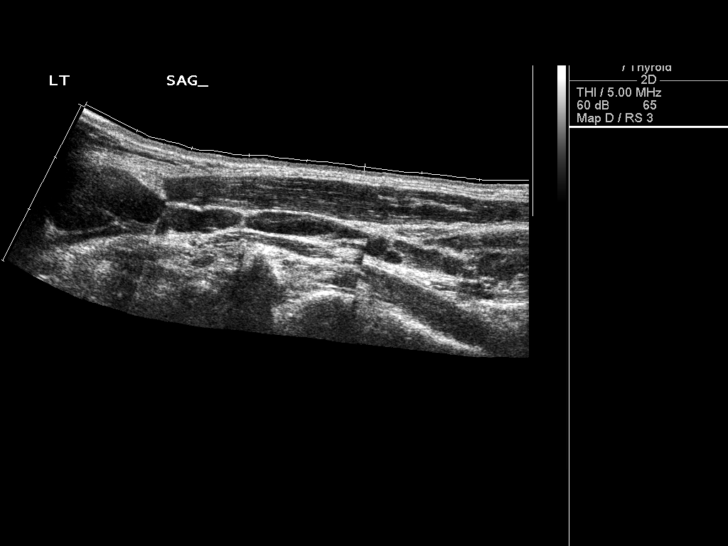
[im 11/11]
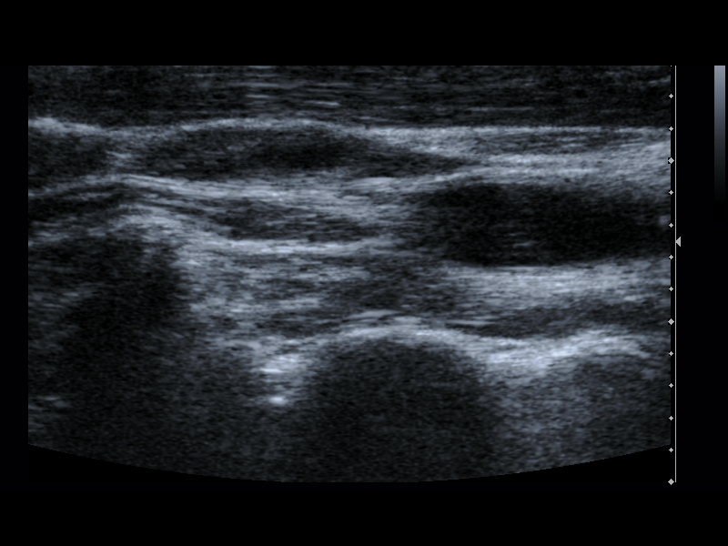

[11 of 11 positions shown; findings below may reference images not displayed]

FINDINGS: Grayscale and color duplex ultrasound performed of the neck in the
region of clinical concern.

Several lymph nodes identified with unremarkable architecture,
borderline enlarged by head and neck criteria.

No focal fluid collection or other soft tissue mass.
IMPRESSION: Sonographic survey of the neck demonstrates borderline enlarged
lymph nodes, with otherwise unremarkable architecture. Etiology not
determined by sonographic survey. Correlation with patient
presentation and lab values may be useful. If there is concern for
abscess, sialadenitis, or malignancy, contrast-enhanced neck CT
should be considered.

## 2017-10-14 ENCOUNTER — Ambulatory Visit: Payer: BLUE CROSS/BLUE SHIELD | Admitting: Physician Assistant

## 2017-10-14 ENCOUNTER — Encounter: Payer: Self-pay | Admitting: Physician Assistant

## 2017-10-14 VITALS — Temp 99.0°F | Resp 16 | Ht 59.0 in | Wt 96.5 lb

## 2017-10-14 DIAGNOSIS — R42 Dizziness and giddiness: Secondary | ICD-10-CM | POA: Diagnosis not present

## 2017-10-14 DIAGNOSIS — R82998 Other abnormal findings in urine: Secondary | ICD-10-CM

## 2017-10-14 DIAGNOSIS — R9431 Abnormal electrocardiogram [ECG] [EKG]: Secondary | ICD-10-CM

## 2017-10-14 LAB — POCT URINALYSIS DIP (MANUAL ENTRY)
BILIRUBIN UA: NEGATIVE
BILIRUBIN UA: NEGATIVE mg/dL
GLUCOSE UA: NEGATIVE mg/dL
Nitrite, UA: NEGATIVE
PROTEIN UA: NEGATIVE mg/dL
SPEC GRAV UA: 1.01 (ref 1.010–1.025)
Urobilinogen, UA: 0.2 E.U./dL
pH, UA: 7 (ref 5.0–8.0)

## 2017-10-14 LAB — POCT CBC
GRANULOCYTE PERCENT: 74.4 % (ref 37–80)
HEMATOCRIT: 39 % (ref 37.7–47.9)
Hemoglobin: 13.3 g/dL (ref 12.2–16.2)
LYMPH, POC: 1.3 (ref 0.6–3.4)
MCH, POC: 32.1 pg — AB (ref 27–31.2)
MCHC: 34.2 g/dL (ref 31.8–35.4)
MCV: 93.8 fL (ref 80–97)
MID (cbc): 0.3 (ref 0–0.9)
MPV: 8.1 fL (ref 0–99.8)
POC Granulocyte: 4.5 (ref 2–6.9)
POC LYMPH %: 21.3 % (ref 10–50)
POC MID %: 4.3 % (ref 0–12)
Platelet Count, POC: 210 10*3/uL (ref 142–424)
RBC: 4.16 M/uL (ref 4.04–5.48)
RDW, POC: 12.1 %
WBC: 6.1 10*3/uL (ref 4.6–10.2)

## 2017-10-14 LAB — POC MICROSCOPIC URINALYSIS (UMFC): Mucus: ABSENT

## 2017-10-14 LAB — POCT URINE PREGNANCY: Preg Test, Ur: NEGATIVE

## 2017-10-14 NOTE — Progress Notes (Signed)
10/14/2017 3:44 PM   DOB: 1981/05/30 / MRN: [id number]  SUBJECTIVE:  Lindsey Church is a 37 y.o. female presenting for dizziness.  This has been present for 6 months or so and comes and goes.  She describes the dizziness as a "passing out feeling." She gets better when she lies down.  She has never had an EKG.  She is a never smoker, does not drink, and   She has No Known Allergies.   She  has no past medical history on file.    She  reports that  has never smoked. she has never used smokeless tobacco. She reports that she does not drink alcohol or use drugs. She  has no sexual activity history on file. The patient  has no past surgical history on file.  Her family history is not on file.  Review of Systems  Constitutional: Negative for chills and fever.  HENT: Negative for congestion, ear discharge, ear pain, hearing loss, nosebleeds, sinus pain, sore throat and tinnitus.   Respiratory: Negative for cough and stridor.   Cardiovascular: Negative for chest pain.  Genitourinary: Negative for dysuria, flank pain, frequency, hematuria and urgency.  Skin: Negative for itching and rash.  Neurological: Positive for dizziness. Negative for tingling, tremors, sensory change, speech change, focal weakness, seizures, loss of consciousness and headaches.    The problem list and medications were reviewed and updated by myself where necessary and exist elsewhere in the encounter.   OBJECTIVE:  Temp 99 F (37.2 C) (Oral)   Resp 16   Ht _0  (1.499 m)   Wt 96 lb 8 oz (43.8 kg)   LMP 09/26/2017   SpO2 99%   BMI 19.49 kg/m   Orthostatic VS for the past 24 hrs:  BP- Lying Pulse- Lying BP- Sitting Pulse- Sitting BP- Standing at 0 minutes Pulse- Standing at 0 minutes  10/14/17 1350 106/60 67 110/62 75 107/70 74      Physical Exam  Constitutional: She is oriented to person, place, and time. She appears well-developed and well-nourished. No distress.  HENT:  Right Ear: External ear  normal.  Left Ear: External ear normal.  Nose: Nose normal.  Mouth/Throat: Oropharynx is clear and moist. No oropharyngeal exudate.  Eyes: Conjunctivae and EOM are normal. Pupils are equal, round, and reactive to light. Right eye exhibits no discharge. Left eye exhibits no discharge. No scleral icterus.  Cardiovascular: Normal rate, regular rhythm and normal heart sounds.  Pulmonary/Chest: Effort normal and breath sounds normal.  Abdominal: Soft. Bowel sounds are normal.  Musculoskeletal: Normal range of motion.  Neurological: She is alert and oriented to person, place, and time.  Skin: Skin is warm and dry. She is not diaphoretic.     Results for orders placed or performed in visit on 10/14/17 (from the past 72 hour(s))  POCT CBC     Status: Abnormal   Collection Time: 10/14/17  2:50 PM  Result Value Ref Range   WBC 6.1 4.6 - 10.2 K/uL   Lymph, poc 1.3 0.6 - 3.4   POC LYMPH PERCENT 21.3 10 - 50 %L   MID (cbc) 0.3 0 - 0.9   POC MID % 4.3 0 - 12 %M   POC Granulocyte 4.5 2 - 6.9   Granulocyte percent 74.4 37 - 80 %G   RBC 4.16 4.04 - 5.48 M/uL   Hemoglobin 13.3 12.2 - 16.2 g/dL   HCT, POC 39.0 37.7 - 47.9 %   MCV 93.8 80 - 97  fL   MCH, POC 32.1 (A) 27 - 31.2 pg   MCHC 34.2 31.8 - 35.4 g/dL   RDW, POC 12.1 %   Platelet Count, POC 210 142 - 424 K/uL   MPV 8.1 0 - 99.8 fL  POCT urinalysis dipstick     Status: Abnormal   Collection Time: 10/14/17  2:56 PM  Result Value Ref Range   Color, UA yellow yellow   Clarity, UA cloudy (A) clear   Glucose, UA negative negative mg/dL   Bilirubin, UA negative negative   Ketones, POC UA negative negative mg/dL   Spec Grav, UA 1.010 1.010 - 1.025   Blood, UA trace-intact (A) negative   pH, UA 7.0 5.0 - 8.0   Protein Ur, POC negative negative mg/dL   Urobilinogen, UA 0.2 0.2 or 1.0 E.U./dL   Nitrite, UA Negative Negative   Leukocytes, UA Large (3+) (A) Negative  POCT Microscopic Urinalysis ([facility])     Status: Abnormal   Collection Time:  10/14/17  3:27 PM  Result Value Ref Range   WBC,UR,HPF,POC Many (A) None WBC/hpf   RBC,UR,HPF,POC None None RBC/hpf   Bacteria Many (A) None, Too numerous to count   Mucus Absent Absent   Epithelial Cells, UR Per Microscopy Few (A) None, Too numerous to count cells/hpf  POCT urine pregnancy     Status: None   Collection Time: 10/14/17  3:40 PM  Result Value Ref Range   Preg Test, Ur Negative Negative    No results found.  ASSESSMENT AND PLAN:  Xana was seen today for dizziness and emesis.  Diagnoses and all orders for this visit:  Dizziness: She is not orthostatic.  She is not dehydrated. She does not describe a vertiginous etiology. She has an abnormal PR interval. See problem 2.  -     EKG 12-Lead -     POCT CBC -     POCT urinalysis dipstick -     TSH -     CMP14+EGFR -     Orthostatic vital signs  Shortened PR interval: I can't say that this is the source of her symptoms however I can't say that it is not.  She needs to see cardiology for further investigation and possibly and holter monitor.  She is very skeptical of this finding on her EKG but agrees to go.  If I have any other lab finding that would better explain her symptoms I feel that she would still need to see cards for clearance given abnormal EKG.   Asymptomatic bacteruria:  -     POCT Microscopic Urinalysis ([facility]) -     Urine Culture    The patient is advised to call or return to clinic if she does not see an improvement in symptoms, or to seek the care of the closest emergency department if she worsens with the above plan.   Philis Fendt, MHS, PA-C [facility] 10/14/2017 3:44 PM

## 2017-10-14 NOTE — Patient Instructions (Addendum)
Please keep your phone on so we can facilitate the referral for you.

## 2017-10-15 LAB — CMP14+EGFR
ALT: 16 IU/L (ref 0–32)
AST: 24 IU/L (ref 0–40)
Albumin/Globulin Ratio: 1.8 (ref 1.2–2.2)
Albumin: 4.8 g/dL (ref 3.5–5.5)
Alkaline Phosphatase: 41 IU/L (ref 39–117)
BUN/Creatinine Ratio: 30 — ABNORMAL HIGH (ref 9–23)
BUN: 18 mg/dL (ref 6–20)
Bilirubin Total: 0.6 mg/dL (ref 0.0–1.2)
CALCIUM: 9 mg/dL (ref 8.7–10.2)
CO2: 25 mmol/L (ref 20–29)
CREATININE: 0.61 mg/dL (ref 0.57–1.00)
Chloride: 102 mmol/L (ref 96–106)
GFR calc Af Amer: 137 mL/min/{1.73_m2} (ref >59)
GFR, EST NON AFRICAN AMERICAN: 119 mL/min/{1.73_m2} (ref >59)
Globulin, Total: 2.6 g/dL (ref 1.5–4.5)
Glucose: 84 mg/dL (ref 65–99)
Potassium: 3.7 mmol/L (ref 3.5–5.2)
Sodium: 141 mmol/L (ref 134–144)
TOTAL PROTEIN: 7.4 g/dL (ref 6.0–8.5)

## 2017-10-15 LAB — TSH: TSH: 1.38 u[IU]/mL (ref 0.450–4.500)

## 2017-10-15 LAB — URINE CULTURE

## 2018-09-10 ENCOUNTER — Ambulatory Visit (INDEPENDENT_AMBULATORY_CARE_PROVIDER_SITE_OTHER): Payer: BLUE CROSS/BLUE SHIELD | Admitting: Emergency Medicine

## 2018-09-10 ENCOUNTER — Other Ambulatory Visit: Payer: Self-pay

## 2018-09-10 ENCOUNTER — Encounter: Payer: Self-pay | Admitting: Emergency Medicine

## 2018-09-10 VITALS — BP 107/66 | HR 89 | Temp 98.4°F | Resp 16 | Ht 60.0 in | Wt 94.6 lb

## 2018-09-10 DIAGNOSIS — Z23 Encounter for immunization: Secondary | ICD-10-CM | POA: Diagnosis not present

## 2018-09-10 DIAGNOSIS — Z1329 Encounter for screening for other suspected endocrine disorder: Secondary | ICD-10-CM

## 2018-09-10 DIAGNOSIS — Z0001 Encounter for general adult medical examination with abnormal findings: Secondary | ICD-10-CM | POA: Diagnosis not present

## 2018-09-10 DIAGNOSIS — Z13220 Encounter for screening for lipoid disorders: Secondary | ICD-10-CM

## 2018-09-10 DIAGNOSIS — Z13228 Encounter for screening for other metabolic disorders: Secondary | ICD-10-CM

## 2018-09-10 DIAGNOSIS — N898 Other specified noninflammatory disorders of vagina: Secondary | ICD-10-CM | POA: Diagnosis not present

## 2018-09-10 DIAGNOSIS — Z130 Encounter for screening for diseases of the blood and blood-forming organs and certain disorders involving the immune mechanism: Secondary | ICD-10-CM

## 2018-09-10 DIAGNOSIS — Z124 Encounter for screening for malignant neoplasm of cervix: Secondary | ICD-10-CM

## 2018-09-10 DIAGNOSIS — B9689 Other specified bacterial agents as the cause of diseases classified elsewhere: Secondary | ICD-10-CM | POA: Insufficient documentation

## 2018-09-10 DIAGNOSIS — N760 Acute vaginitis: Secondary | ICD-10-CM | POA: Diagnosis not present

## 2018-09-10 DIAGNOSIS — Z0000 Encounter for general adult medical examination without abnormal findings: Secondary | ICD-10-CM

## 2018-09-10 LAB — POCT WET + KOH PREP
Trich by wet prep: ABSENT
YEAST BY WET PREP: ABSENT
Yeast by KOH: ABSENT

## 2018-09-10 MED ORDER — METRONIDAZOLE 375 MG PO CAPS: 375.0000 mg | ORAL_CAPSULE | Freq: Two times a day (BID) | ORAL | 0 refills | Status: AC

## 2018-09-10 NOTE — Patient Instructions (Addendum)
If you have lab work done today you will be contacted with your lab results within the next 2 weeks.  If you have not heard from [place] then please contact us. The fastest way to get your results is to register for My Chart.   IF you received an x-ray today, you will receive an invoice from [facility]. Please contact [facility] at [phone] with questions or concerns regarding your invoice.   IF you received labwork today, you will receive an invoice from [place]. Please contact [facility] at [phone] with questions or concerns regarding your invoice.   Our billing staff will not be able to assist you with questions regarding bills from these companies.  You will be contacted with the lab results as soon as they are available. The fastest way to get your results is to activate your My Chart account. Instructions are located on the last page of this paperwork. If you have not heard from [place] regarding the results in 2 weeks, please contact this office.     Health Maintenance, Female Adopting a healthy lifestyle and getting preventive care can go a long way to promote health and wellness. Talk with your health care provider about what schedule of regular examinations is right for you. This is a good chance for you to check in with your provider about disease prevention and staying healthy. In between checkups, there are plenty of things you can do on your own. Experts have done a lot of research about which lifestyle changes and preventive measures are most likely to keep you healthy. Ask your health care provider for more information. Weight and diet Eat a healthy diet  Be sure to include plenty of vegetables, fruits, low-fat dairy products, and lean protein.  Do not eat a lot of foods high in solid fats, added sugars, or salt.  Get regular exercise. This is one of the most important things you can do for your health. ? Most adults should exercise for at least 150  minutes each week. The exercise should increase your heart rate and make you sweat (moderate-intensity exercise). ? Most adults should also do strengthening exercises at least twice a week. This is in addition to the moderate-intensity exercise.  Maintain a healthy weight  Body mass index (BMI) is a measurement that can be used to identify possible weight problems. It estimates body fat based on height and weight. Your health care provider can help determine your BMI and help you achieve or maintain a healthy weight.  For females 22 years of age and older: ? A BMI below 18.5 is considered underweight. ? A BMI of 18.5 to 24.9 is normal. ? A BMI of 25 to 29.9 is considered overweight. ? A BMI of 30 and above is considered obese.  Watch levels of cholesterol and blood lipids  You should start having your blood tested for lipids and cholesterol at 35 years of age, then have this test every 5 years.  You may need to have your cholesterol levels checked more often if: ? Your lipid or cholesterol levels are high. ? You are older than 35 years of age. ? You are at high risk for heart disease.  Cancer screening Lung Cancer  Lung cancer screening is recommended for adults 58-34 years old who are at high risk for lung cancer because of a history of smoking.  A yearly low-dose CT scan of the lungs is recommended for people who: ? Currently smoke. ? Have quit  within the past 15 years. ? Have at least a 30-pack-year history of smoking. A pack year is smoking an average of one pack of cigarettes a day for 1 year.  Yearly screening should continue until it has been 15 years since you quit.  Yearly screening should stop if you develop a health problem that would prevent you from having lung cancer treatment.  Breast Cancer  Practice breast self-awareness. This means understanding how your breasts normally appear and feel.  It also means doing regular breast self-exams. Let your health care  provider know about any changes, no matter how small.  If you are in your 20s or 30s, you should have a clinical breast exam (CBE) by a health care provider every 1-3 years as part of a regular health exam.  If you are 48 or older, have a CBE every year. Also consider having a breast X-ray (mammogram) every year.  If you have a family history of breast cancer, talk to your health care provider about genetic screening.  If you are at high risk for breast cancer, talk to your health care provider about having an MRI and a mammogram every year.  Breast cancer gene (BRCA) assessment is recommended for women who have family members with BRCA-related cancers. BRCA-related cancers include: ? Breast. ? Ovarian. ? Tubal. ? Peritoneal cancers.  Results of the assessment will determine the need for genetic counseling and BRCA1 and BRCA2 testing.  Cervical Cancer Your health care provider may recommend that you be screened regularly for cancer of the pelvic organs (ovaries, uterus, and vagina). This screening involves a pelvic examination, including checking for microscopic changes to the surface of your cervix (Pap test). You may be encouraged to have this screening done every 3 years, beginning at age 8.  For women ages 8-65, health care providers may recommend pelvic exams and Pap testing every 3 years, or they may recommend the Pap and pelvic exam, combined with testing for human papilloma virus (HPV), every 5 years. Some types of HPV increase your risk of cervical cancer. Testing for HPV may also be done on women of any age with unclear Pap test results.  Other health care providers may not recommend any screening for nonpregnant women who are considered low risk for pelvic cancer and who do not have symptoms. Ask your health care provider if a screening pelvic exam is right for you.  If you have had past treatment for cervical cancer or a condition that could lead to cancer, you need Pap tests  and screening for cancer for at least 20 years after your treatment. If Pap tests have been discontinued, your risk factors (such as having a new sexual partner) need to be reassessed to determine if screening should resume. Some women have medical problems that increase the chance of getting cervical cancer. In these cases, your health care provider may recommend more frequent screening and Pap tests.  Colorectal Cancer  This type of cancer can be detected and often prevented.  Routine colorectal cancer screening usually begins at 35 years of age and continues through 35 years of age.  Your health care provider may recommend screening at an earlier age if you have risk factors for colon cancer.  Your health care provider may also recommend using home test kits to check for hidden blood in the stool.  A small camera at the end of a tube can be used to examine your colon directly (sigmoidoscopy or colonoscopy). This is done to check  for the earliest forms of colorectal cancer.  Routine screening usually begins at age 75.  Direct examination of the colon should be repeated every 5-10 years through 35 years of age. However, you may need to be screened more often if early forms of precancerous polyps or small growths are found.  Skin Cancer  Check your skin from head to toe regularly.  Tell your health care provider about any new moles or changes in moles, especially if there is a change in a mole's shape or color.  Also tell your health care provider if you have a mole that is larger than the size of a pencil eraser.  Always use sunscreen. Apply sunscreen liberally and repeatedly throughout the day.  Protect yourself by wearing long sleeves, pants, a wide-brimmed hat, and sunglasses whenever you are outside.  Heart disease, diabetes, and high blood pressure  High blood pressure causes heart disease and increases the risk of stroke. High blood pressure is more likely to develop  in: ? People who have blood pressure in the high end of the normal range (130-139/85-89 mm Hg). ? People who are overweight or obese. ? People who are African American.  If you are 41-67 years of age, have your blood pressure checked every 3-5 years. If you are 32 years of age or older, have your blood pressure checked every year. You should have your blood pressure measured twice-once when you are at a hospital or clinic, and once when you are not at a hospital or clinic. Record the average of the two measurements. To check your blood pressure when you are not at a hospital or clinic, you can use: ? An automated blood pressure machine at a pharmacy. ? A home blood pressure monitor.  If you are between 44 years and 70 years old, ask your health care provider if you should take aspirin to prevent strokes.  Have regular diabetes screenings. This involves taking a blood sample to check your fasting blood sugar level. ? If you are at a normal weight and have a low risk for diabetes, have this test once every three years after 35 years of age. ? If you are overweight and have a high risk for diabetes, consider being tested at a younger age or more often. Preventing infection Hepatitis B  If you have a higher risk for hepatitis B, you should be screened for this virus. You are considered at high risk for hepatitis B if: ? You were born in a country where hepatitis B is common. Ask your health care provider which countries are considered high risk. ? Your parents were born in a high-risk country, and you have not been immunized against hepatitis B (hepatitis B vaccine). ? You have HIV or AIDS. ? You use needles to inject street drugs. ? You live with someone who has hepatitis B. ? You have had sex with someone who has hepatitis B. ? You get hemodialysis treatment. ? You take certain medicines for conditions, including cancer, organ transplantation, and autoimmune conditions.  Hepatitis C  Blood  testing is recommended for: ? Everyone born from [birth year] through 1963. ? Anyone with known risk factors for hepatitis C.  Sexually transmitted infections (STIs)  You should be screened for sexually transmitted infections (STIs) including gonorrhea and chlamydia if: ? You are sexually active and are younger than 35 years of age. ? You are older than 35 years of age and your health care provider tells you that you are at risk for  this type of infection. ? Your sexual activity has changed since you were last screened and you are at an increased risk for chlamydia or gonorrhea. Ask your health care provider if you are at risk.  If you do not have HIV, but are at risk, it may be recommended that you take a prescription medicine daily to prevent HIV infection. This is called pre-exposure prophylaxis (PrEP). You are considered at risk if: ? You are sexually active and do not regularly use condoms or know the HIV status of your partner(s). ? You take drugs by injection. ? You are sexually active with a partner who has HIV.  Talk with your health care provider about whether you are at high risk of being infected with HIV. If you choose to begin PrEP, you should first be tested for HIV. You should then be tested every 3 months for as long as you are taking PrEP. Pregnancy  If you are premenopausal and you may become pregnant, ask your health care provider about preconception counseling.  If you may become pregnant, take 400 to 800 micrograms (mcg) of folic acid every day.  If you want to prevent pregnancy, talk to your health care provider about birth control (contraception). Osteoporosis and menopause  Osteoporosis is a disease in which the bones lose minerals and strength with aging. This can result in serious bone fractures. Your risk for osteoporosis can be identified using a bone density scan.  If you are 68 years of age or older, or if you are at risk for osteoporosis and fractures, ask your  health care provider if you should be screened.  Ask your health care provider whether you should take a calcium or vitamin D supplement to lower your risk for osteoporosis.  Menopause may have certain physical symptoms and risks.  Hormone replacement therapy may reduce some of these symptoms and risks. Talk to your health care provider about whether hormone replacement therapy is right for you. Follow these instructions at home:  Schedule regular health, dental, and eye exams.  Stay current with your immunizations.  Do not use any tobacco products including cigarettes, chewing tobacco, or electronic cigarettes.  If you are pregnant, do not drink alcohol.  If you are breastfeeding, limit how much and how often you drink alcohol.  Limit alcohol intake to no more than 1 drink per day for nonpregnant women. One drink equals 12 ounces of beer, 5 ounces of wine, or 1 ounces of hard liquor.  Do not use street drugs.  Do not share needles.  Ask your health care provider for help if you need support or information about quitting drugs.  Tell your health care provider if you often feel depressed.  Tell your health care provider if you have ever been abused or do not feel safe at home. This information is not intended to replace advice given to you by your health care provider. Make sure you discuss any questions you have with your health care provider. Document Released: 04/04/2011 Document Revised: 02/25/2016 Document Reviewed: 06/23/2015 [employer] Interactive Patient Education  [employer].

## 2018-09-10 NOTE — Progress Notes (Signed)
Lindsey Church 37 y.o.   Chief Complaint  Patient presents with  . Annual Exam    with pap smear - FEMALE ONLY FOR THE PAP SMEAR    HISTORY OF PRESENT ILLNESS: This is a 37 y.o. female here today for her annual exam.  Requesting female provider for her Pap smear. No chronic medical problems.  On no medications.  No allergies.  States she is up-to-date with her GYN regular appointments.  No concerns.  Non-smoker. Translator named Cristelle [phone]. Complaining of intermittent pelvic pain with vaginal discharge.  HPI   Prior to Admission medications   Not on File    No Known Allergies  Patient Active Problem List   Diagnosis Date Noted  . Leucopenia 04/30/2016    No past medical history on file.  No past surgical history on file.  Social History   Socioeconomic History  . Marital status: Married    Spouse name: Not on file  . Number of children: 2  . Years of education: Not on file  . Highest education level: Not on file  Occupational History  . Occupation: food Designer, industrial/product    Comment: [employer]  Social Needs  . Financial resource strain: Not on file  . Food insecurity:    Worry: Not on file    Inability: Not on file  . Transportation needs:    Medical: Not on file    Non-medical: Not on file  Tobacco Use  . Smoking status: Never Smoker  . Smokeless tobacco: Never Used  Substance and Sexual Activity  . Alcohol use: No    Alcohol/week: 0.0 standard drinks  . Drug use: No  . Sexual activity: Not on file  Lifestyle  . Physical activity:    Days per week: Not on file    Minutes per session: Not on file  . Stress: Not on file  Relationships  . Social connections:    Talks on phone: Not on file    Gets together: Not on file    Attends religious service: Not on file    Active member of club or organization: Not on file    Attends meetings of clubs or organizations: Not on file    Relationship status: Not on file  . Intimate  partner violence:    Fear of current or ex partner: Not on file    Emotionally abused: Not on file    Physically abused: Not on file    Forced sexual activity: Not on file  Other Topics Concern  . Not on file  Social History Narrative   From [place].   Came to the [place] in 2005.   Lives with her husband and their 2 sons.    No family history on file.   Review of Systems  Constitutional: Negative.  Negative for chills and fever.  HENT: Negative.  Negative for hearing loss, nosebleeds and sore throat.   Eyes: Negative.  Negative for blurred vision and double vision.  Respiratory: Negative.  Negative for cough, hemoptysis and shortness of breath.   Cardiovascular: Negative.  Negative for chest pain and palpitations.  Gastrointestinal: Negative.  Negative for abdominal pain, blood in stool, diarrhea, melena, nausea and vomiting.  Genitourinary: Negative.  Negative for dysuria, hematuria and urgency.       Pelvic pain and discharge  Musculoskeletal:       Occasional pain to her left trapezius muscle  Skin: Negative.  Negative for rash.  Neurological: Negative.  Negative for dizziness and  headaches.  Endo/Heme/Allergies: Negative.   All other systems reviewed and are negative.   Vitals:   09/10/18 1332  BP: 107/66  Pulse: 89  Resp: 16  Temp: 98.4 F (36.9 C)  SpO2: 97%    Physical Exam  Constitutional: She is oriented to person, place, and time. She appears well-developed and well-nourished.  HENT:  Head: Normocephalic and atraumatic.  Right Ear: External ear normal.  Left Ear: External ear normal.  Nose: Nose normal.  Mouth/Throat: Oropharynx is clear and moist.  Eyes: Pupils are equal, round, and reactive to light. Conjunctivae and EOM are normal.  Neck: Normal range of motion. Neck supple. No thyromegaly present.  Cardiovascular: Normal rate, regular rhythm and normal heart sounds.  Pulmonary/Chest: Effort normal and breath sounds normal.  Abdominal: Soft. Bowel sounds  are normal. She exhibits no distension. There is no tenderness.  Musculoskeletal: Normal range of motion. She exhibits no edema or tenderness.  Lymphadenopathy:    She has no cervical adenopathy.  Neurological: She is alert and oriented to person, place, and time. No sensory deficit. She exhibits normal muscle tone. Coordination normal.  Skin: Skin is warm and dry. Capillary refill takes less than 2 seconds. No rash noted.  Psychiatric: She has a normal mood and affect. Her behavior is normal.  Vitals reviewed.  Results for orders placed or performed in visit on 09/10/18 (from the past 24 hour(s))  POCT Wet + KOH Prep     Status: Abnormal   Collection Time: 09/10/18  3:47 PM  Result Value Ref Range   Yeast by KOH Absent Absent   Yeast by wet prep Absent Absent   WBC by wet prep Too numerous to count  (A) Few   Clue Cells Wet Prep HPF POC None None   Trich by wet prep Absent Absent   Bacteria Wet Prep HPF POC Many (A) Few   Epithelial Cells By [employer] Pref ([facility]) Moderate (A) None, Few, Too numerous to count   RBC,UR,HPF,POC None None RBC/hpf     ASSESSMENT & PLAN: Lindsey Church was seen today for annual exam.  Diagnoses and all orders for this visit:  Routine general medical examination at a health care facility -     Comprehensive metabolic panel  Need for prophylactic vaccination and inoculation against influenza -     Flu Vaccine QUAD 36+ mos IM  Screening for deficiency anemia -     CBC with Differential/Platelet  Screening for lipoid disorders -     Lipid panel  Screening for endocrine, metabolic and immunity disorder -     Comprehensive metabolic panel -     Lipid panel -     TSH  Cervical cancer screening -     Pap IG, CT/NG NAA, and HPV (high risk)  Vaginal discharge -     POCT Wet + KOH Prep  Bacterial vaginosis -     metronidazole (FLAGYL) 375 MG capsule; Take 1 capsule (375 mg total) by mouth 2 (two) times daily for 7 days.    Patient Instructions        If you have lab work done today you will be contacted with your lab results within the next 2 weeks.  If you have not heard from [place] then please contact us. The fastest way to get your results is to register for My Chart.   IF you received an x-ray today, you will receive an invoice from [facility]. Please contact [facility] at [phone] with questions or concerns regarding  your invoice.   IF you received labwork today, you will receive an invoice from [facility]. Please contact [facility] at [phone] with questions or concerns regarding your invoice.   Our billing staff will not be able to assist you with questions regarding bills from these companies.  You will be contacted with the lab results as soon as they are available. The fastest way to get your results is to activate your My Chart account. Instructions are located on the last page of this paperwork. If you have not heard from [place] regarding the results in 2 weeks, please contact this office.     Health Maintenance, Female Adopting a healthy lifestyle and getting preventive care can go a long way to promote health and wellness. Talk with your health care provider about what schedule of regular examinations is right for you. This is a good chance for you to check in with your provider about disease prevention and staying healthy. In between checkups, there are plenty of things you can do on your own. Experts have done a lot of research about which lifestyle changes and preventive measures are most likely to keep you healthy. Ask your health care provider for more information. Weight and diet Eat a healthy diet  Be sure to include plenty of vegetables, fruits, low-fat dairy products, and lean protein.  Do not eat a lot of foods high in solid fats, added sugars, or salt.  Get regular exercise. This is one of the most important things you can do for your health. ? Most adults should exercise for at least  150 minutes each week. The exercise should increase your heart rate and make you sweat (moderate-intensity exercise). ? Most adults should also do strengthening exercises at least twice a week. This is in addition to the moderate-intensity exercise.  Maintain a healthy weight  Body mass index (BMI) is a measurement that can be used to identify possible weight problems. It estimates body fat based on height and weight. Your health care provider can help determine your BMI and help you achieve or maintain a healthy weight.  For females 78 years of age and older: ? A BMI below 18.5 is considered underweight. ? A BMI of 18.5 to 24.9 is normal. ? A BMI of 25 to 29.9 is considered overweight. ? A BMI of 30 and above is considered obese.  Watch levels of cholesterol and blood lipids  You should start having your blood tested for lipids and cholesterol at 35 years of age, then have this test every 5 years.  You may need to have your cholesterol levels checked more often if: ? Your lipid or cholesterol levels are high. ? You are older than 35 years of age. ? You are at high risk for heart disease.  Cancer screening Lung Cancer  Lung cancer screening is recommended for adults 11-76 years old who are at high risk for lung cancer because of a history of smoking.  A yearly low-dose CT scan of the lungs is recommended for people who: ? Currently smoke. ? Have quit within the past 15 years. ? Have at least a 30-pack-year history of smoking. A pack year is smoking an average of one pack of cigarettes a day for 1 year.  Yearly screening should continue until it has been 15 years since you quit.  Yearly screening should stop if you develop a health problem that would prevent you from having lung cancer treatment.  Breast Cancer  Practice breast self-awareness. This means understanding  how your breasts normally appear and feel.  It also means doing regular breast self-exams. Let your health care  provider know about any changes, no matter how small.  If you are in your 20s or 30s, you should have a clinical breast exam (CBE) by a health care provider every 1-3 years as part of a regular health exam.  If you are 62 or older, have a CBE every year. Also consider having a breast X-ray (mammogram) every year.  If you have a family history of breast cancer, talk to your health care provider about genetic screening.  If you are at high risk for breast cancer, talk to your health care provider about having an MRI and a mammogram every year.  Breast cancer gene (BRCA) assessment is recommended for women who have family members with BRCA-related cancers. BRCA-related cancers include: ? Breast. ? Ovarian. ? Tubal. ? Peritoneal cancers.  Results of the assessment will determine the need for genetic counseling and BRCA1 and BRCA2 testing.  Cervical Cancer Your health care provider may recommend that you be screened regularly for cancer of the pelvic organs (ovaries, uterus, and vagina). This screening involves a pelvic examination, including checking for microscopic changes to the surface of your cervix (Pap test). You may be encouraged to have this screening done every 3 years, beginning at age 51.  For women ages 12-65, health care providers may recommend pelvic exams and Pap testing every 3 years, or they may recommend the Pap and pelvic exam, combined with testing for human papilloma virus (HPV), every 5 years. Some types of HPV increase your risk of cervical cancer. Testing for HPV may also be done on women of any age with unclear Pap test results.  Other health care providers may not recommend any screening for nonpregnant women who are considered low risk for pelvic cancer and who do not have symptoms. Ask your health care provider if a screening pelvic exam is right for you.  If you have had past treatment for cervical cancer or a condition that could lead to cancer, you need Pap tests  and screening for cancer for at least 20 years after your treatment. If Pap tests have been discontinued, your risk factors (such as having a new sexual partner) need to be reassessed to determine if screening should resume. Some women have medical problems that increase the chance of getting cervical cancer. In these cases, your health care provider may recommend more frequent screening and Pap tests.  Colorectal Cancer  This type of cancer can be detected and often prevented.  Routine colorectal cancer screening usually begins at 35 years of age and continues through 35 years of age.  Your health care provider may recommend screening at an earlier age if you have risk factors for colon cancer.  Your health care provider may also recommend using home test kits to check for hidden blood in the stool.  A small camera at the end of a tube can be used to examine your colon directly (sigmoidoscopy or colonoscopy). This is done to check for the earliest forms of colorectal cancer.  Routine screening usually begins at age 69.  Direct examination of the colon should be repeated every 5-10 years through 35 years of age. However, you may need to be screened more often if early forms of precancerous polyps or small growths are found.  Skin Cancer  Check your skin from head to toe regularly.  Tell your health care provider about any new moles or  changes in moles, especially if there is a change in a mole's shape or color.  Also tell your health care provider if you have a mole that is larger than the size of a pencil eraser.  Always use sunscreen. Apply sunscreen liberally and repeatedly throughout the day.  Protect yourself by wearing long sleeves, pants, a wide-brimmed hat, and sunglasses whenever you are outside.  Heart disease, diabetes, and high blood pressure  High blood pressure causes heart disease and increases the risk of stroke. High blood pressure is more likely to develop  in: ? People who have blood pressure in the high end of the normal range (130-139/85-89 mm Hg). ? People who are overweight or obese. ? People who are African American.  If you are 40-4 years of age, have your blood pressure checked every 3-5 years. If you are 41 years of age or older, have your blood pressure checked every year. You should have your blood pressure measured twice-once when you are at a hospital or clinic, and once when you are not at a hospital or clinic. Record the average of the two measurements. To check your blood pressure when you are not at a hospital or clinic, you can use: ? An automated blood pressure machine at a pharmacy. ? A home blood pressure monitor.  If you are between 74 years and 7 years old, ask your health care provider if you should take aspirin to prevent strokes.  Have regular diabetes screenings. This involves taking a blood sample to check your fasting blood sugar level. ? If you are at a normal weight and have a low risk for diabetes, have this test once every three years after 35 years of age. ? If you are overweight and have a high risk for diabetes, consider being tested at a younger age or more often. Preventing infection Hepatitis B  If you have a higher risk for hepatitis B, you should be screened for this virus. You are considered at high risk for hepatitis B if: ? You were born in a country where hepatitis B is common. Ask your health care provider which countries are considered high risk. ? Your parents were born in a high-risk country, and you have not been immunized against hepatitis B (hepatitis B vaccine). ? You have HIV or AIDS. ? You use needles to inject street drugs. ? You live with someone who has hepatitis B. ? You have had sex with someone who has hepatitis B. ? You get hemodialysis treatment. ? You take certain medicines for conditions, including cancer, organ transplantation, and autoimmune conditions.  Hepatitis C  Blood  testing is recommended for: ? Everyone born from [birth year] through 1963. ? Anyone with known risk factors for hepatitis C.  Sexually transmitted infections (STIs)  You should be screened for sexually transmitted infections (STIs) including gonorrhea and chlamydia if: ? You are sexually active and are younger than 35 years of age. ? You are older than 35 years of age and your health care provider tells you that you are at risk for this type of infection. ? Your sexual activity has changed since you were last screened and you are at an increased risk for chlamydia or gonorrhea. Ask your health care provider if you are at risk.  If you do not have HIV, but are at risk, it may be recommended that you take a prescription medicine daily to prevent HIV infection. This is called pre-exposure prophylaxis (PrEP). You are considered at risk if: ?  You are sexually active and do not regularly use condoms or know the HIV status of your partner(s). ? You take drugs by injection. ? You are sexually active with a partner who has HIV.  Talk with your health care provider about whether you are at high risk of being infected with HIV. If you choose to begin PrEP, you should first be tested for HIV. You should then be tested every 3 months for as long as you are taking PrEP. Pregnancy  If you are premenopausal and you may become pregnant, ask your health care provider about preconception counseling.  If you may become pregnant, take 400 to 800 micrograms (mcg) of folic acid every day.  If you want to prevent pregnancy, talk to your health care provider about birth control (contraception). Osteoporosis and menopause  Osteoporosis is a disease in which the bones lose minerals and strength with aging. This can result in serious bone fractures. Your risk for osteoporosis can be identified using a bone density scan.  If you are 38 years of age or older, or if you are at risk for osteoporosis and fractures, ask your  health care provider if you should be screened.  Ask your health care provider whether you should take a calcium or vitamin D supplement to lower your risk for osteoporosis.  Menopause may have certain physical symptoms and risks.  Hormone replacement therapy may reduce some of these symptoms and risks. Talk to your health care provider about whether hormone replacement therapy is right for you. Follow these instructions at home:  Schedule regular health, dental, and eye exams.  Stay current with your immunizations.  Do not use any tobacco products including cigarettes, chewing tobacco, or electronic cigarettes.  If you are pregnant, do not drink alcohol.  If you are breastfeeding, limit how much and how often you drink alcohol.  Limit alcohol intake to no more than 1 drink per day for nonpregnant women. One drink equals 12 ounces of beer, 5 ounces of wine, or 1 ounces of hard liquor.  Do not use street drugs.  Do not share needles.  Ask your health care provider for help if you need support or information about quitting drugs.  Tell your health care provider if you often feel depressed.  Tell your health care provider if you have ever been abused or do not feel safe at home. This information is not intended to replace advice given to you by your health care provider. Make sure you discuss any questions you have with your health care provider. Document Released: 04/04/2011 Document Revised: 02/25/2016 Document Reviewed: 06/23/2015 Elsevier Interactive Patient Education  2018 Elsevier Inc.      Agustina Caroli, MD [facility]

## 2018-09-11 ENCOUNTER — Encounter: Payer: Self-pay | Admitting: *Deleted

## 2018-09-11 LAB — CBC WITH DIFFERENTIAL/PLATELET
Basophils Absolute: 0 10*3/uL (ref 0.0–0.2)
Basos: 0 %
EOS (ABSOLUTE): 0 10*3/uL (ref 0.0–0.4)
Eos: 0 %
Hematocrit: 40.4 % (ref 34.0–46.6)
Hemoglobin: 13.4 g/dL (ref 11.1–15.9)
Immature Grans (Abs): 0 10*3/uL (ref 0.0–0.1)
Immature Granulocytes: 0 %
LYMPHS ABS: 0.9 10*3/uL (ref 0.7–3.1)
Lymphs: 18 %
MCH: 31.5 pg (ref 26.6–33.0)
MCHC: 33.2 g/dL (ref 31.5–35.7)
MCV: 95 fL (ref 79–97)
MONOCYTES: 5 %
Monocytes Absolute: 0.2 10*3/uL (ref 0.1–0.9)
Neutrophils Absolute: 4 10*3/uL (ref 1.4–7.0)
Neutrophils: 77 %
Platelets: 245 10*3/uL (ref 150–450)
RBC: 4.25 x10E6/uL (ref 3.77–5.28)
RDW: 11.8 % — ABNORMAL LOW (ref 12.3–15.4)
WBC: 5.2 10*3/uL (ref 3.4–10.8)

## 2018-09-11 LAB — COMPREHENSIVE METABOLIC PANEL
ALK PHOS: 40 IU/L (ref 39–117)
ALT: 11 IU/L (ref 0–32)
AST: 16 IU/L (ref 0–40)
Albumin/Globulin Ratio: 2 (ref 1.2–2.2)
Albumin: 4.7 g/dL (ref 3.5–5.5)
BUN/Creatinine Ratio: 21 (ref 9–23)
BUN: 11 mg/dL (ref 6–20)
Bilirubin Total: 0.6 mg/dL (ref 0.0–1.2)
CALCIUM: 9 mg/dL (ref 8.7–10.2)
CHLORIDE: 100 mmol/L (ref 96–106)
CO2: 24 mmol/L (ref 20–29)
Creatinine, Ser: 0.53 mg/dL — ABNORMAL LOW (ref 0.57–1.00)
GFR calc Af Amer: 142 mL/min/{1.73_m2} (ref >59)
GFR, EST NON AFRICAN AMERICAN: 123 mL/min/{1.73_m2} (ref >59)
Globulin, Total: 2.3 g/dL (ref 1.5–4.5)
Glucose: 79 mg/dL (ref 65–99)
POTASSIUM: 3.8 mmol/L (ref 3.5–5.2)
Sodium: 141 mmol/L (ref 134–144)
Total Protein: 7 g/dL (ref 6.0–8.5)

## 2018-09-11 LAB — LIPID PANEL
CHOL/HDL RATIO: 2.9 ratio (ref 0.0–4.4)
CHOLESTEROL TOTAL: 188 mg/dL (ref 100–199)
HDL: 64 mg/dL (ref >39)
LDL Calculated: 115 mg/dL — ABNORMAL HIGH (ref 0–99)
TRIGLYCERIDES: 44 mg/dL (ref 0–149)
VLDL Cholesterol Cal: 9 mg/dL (ref 5–40)

## 2018-09-11 LAB — TSH: TSH: 1.15 u[IU]/mL (ref 0.450–4.500)

## 2018-09-12 ENCOUNTER — Telehealth: Payer: Self-pay | Admitting: Emergency Medicine

## 2018-09-12 NOTE — Telephone Encounter (Signed)
Copied from CRM [id number]. Topic: Quick Communication - Rx Refill/Question >> Sep 12, 2018  3:28 PM Wyonia HoughJohnson, Chaz E wrote: Medication: metronidazole (FLAGYL) 375 MG capsule  Has the patient contacted their pharmacy? Yes.  - Pharmacy called and wants to see if Pt can do 1.5 tablets of the 250 MG Metronidazole instead of the 375 MG   Preferred Pharmacy (with phone number or street name): [facility] [address] - [place], [place] - [address] [phone] (Phone) [phone] (Fax)    Agent: Please be advised that RX refills may take up to 3 business days. We ask that you follow-up with your pharmacy.

## 2018-09-13 ENCOUNTER — Encounter: Payer: Self-pay | Admitting: Radiology

## 2018-09-13 LAB — PAP IG, CT-NG NAA, HPV HIGH-RISK
Chlamydia, Nuc. Acid Amp: NEGATIVE
GONOCOCCUS BY NUCLEIC ACID AMP: NEGATIVE
HPV, high-risk: NEGATIVE

## 2018-09-14 ENCOUNTER — Other Ambulatory Visit: Payer: Self-pay | Admitting: *Deleted

## 2018-09-14 DIAGNOSIS — B9689 Other specified bacterial agents as the cause of diseases classified elsewhere: Secondary | ICD-10-CM

## 2018-09-14 DIAGNOSIS — N760 Acute vaginitis: Principal | ICD-10-CM

## 2018-09-14 NOTE — Telephone Encounter (Signed)
Yes, it is acceptable.  Thanks.
